# Patient Record
Sex: Male | Born: 1980 | Race: White | Hispanic: No | Marital: Single | Smoking: Current every day smoker
Health system: Southern US, Community
[De-identification: ages and names within clinical notes are randomized; demographics above are authoritative.]

## PROBLEM LIST (undated history)

## (undated) HISTORY — PX: TONSILLECTOMY: SUR1361

---

## 2015-05-21 ENCOUNTER — Encounter (HOSPITAL_COMMUNITY): Payer: Self-pay | Admitting: Emergency Medicine

## 2015-05-21 ENCOUNTER — Emergency Department (HOSPITAL_COMMUNITY)
Admission: EM | Admit: 2015-05-21 | Discharge: 2015-05-21 | Disposition: A | Discharge status: Home / Self Care | Payer: BLUE CROSS/BLUE SHIELD | Attending: Emergency Medicine | Admitting: Emergency Medicine

## 2015-05-21 DIAGNOSIS — W260XXA Contact with knife, initial encounter: Secondary | ICD-10-CM | POA: Diagnosis not present

## 2015-05-21 DIAGNOSIS — F17210 Nicotine dependence, cigarettes, uncomplicated: Secondary | ICD-10-CM | POA: Insufficient documentation

## 2015-05-21 DIAGNOSIS — Y9289 Other specified places as the place of occurrence of the external cause: Secondary | ICD-10-CM | POA: Diagnosis not present

## 2015-05-21 DIAGNOSIS — Z23 Encounter for immunization: Secondary | ICD-10-CM | POA: Insufficient documentation

## 2015-05-21 DIAGNOSIS — Y998 Other external cause status: Secondary | ICD-10-CM | POA: Diagnosis not present

## 2015-05-21 DIAGNOSIS — Y9389 Activity, other specified: Secondary | ICD-10-CM | POA: Diagnosis not present

## 2015-05-21 DIAGNOSIS — S61412A Laceration without foreign body of left hand, initial encounter: Secondary | ICD-10-CM

## 2015-05-21 MED ORDER — LIDOCAINE-EPINEPHRINE 1 %-1:100000 IJ SOLN: 10.0000 mL | Freq: Once | INTRAMUSCULAR | Status: DC

## 2015-05-21 MED ORDER — CEPHALEXIN 500 MG PO CAPS
500.0000 mg | ORAL_CAPSULE | Freq: Three times a day (TID) | ORAL | Status: AC
Start: 2015-05-21 — End: ?

## 2015-05-21 MED ORDER — IBUPROFEN 800 MG PO TABS: 800.0000 mg | ORAL_TABLET | Freq: Three times a day (TID) | ORAL | Status: AC

## 2015-05-21 MED ORDER — TETANUS-DIPHTH-ACELL PERTUSSIS 5-2.5-18.5 LF-MCG/0.5 IM SUSP
0.5000 mL | Freq: Once | INTRAMUSCULAR | Status: AC
  Administered 2015-05-21: 0.5 mL via INTRAMUSCULAR
  Filled 2015-05-21: qty 0.5

## 2015-05-21 MED ORDER — LIDOCAINE-EPINEPHRINE 2 %-1:100000 IJ SOLN
20.0000 mL | Freq: Once | INTRAMUSCULAR | Status: AC
  Administered 2015-05-21: 1 mL
  Filled 2015-05-21: qty 1

## 2015-05-21 MED ORDER — BACITRACIN ZINC 500 UNIT/GM EX OINT
1.0000 "application " | TOPICAL_OINTMENT | Freq: Two times a day (BID) | CUTANEOUS | Status: DC
  Administered 2015-05-21: 1 via TOPICAL
  Filled 2015-05-21: qty 0.9

## 2015-05-21 NOTE — ED Notes (Signed)
Pt states he was trying to pry a window open with a butter knife and the knife slipped cutting his right hand  Pt has a laceration noted to his right hand just above his thumb  Bleeding controlled  Pt is able to move his thumb

## 2015-05-21 NOTE — Discharge Instructions (Signed)

## 2015-05-21 NOTE — ED Provider Notes (Signed)
CSN: [id number]     Arrival date & time 05/21/15  1938 History   First MD Initiated Contact with Patient 05/21/15 1952     Chief Complaint  Patient presents with  . Extremity Laceration     (Consider location/radiation/quality/duration/timing/severity/associated sxs/prior Treatment) HPI  1 hour ago accidentally cut his L hand over the [facility] with a butter knife trying to open a window Pain is constant Mild Blood with this No nubmness Dressing pta with success  History reviewed. No pertinent past medical history. Past Surgical History  Procedure Laterality Date  . Tonsillectomy     Family History  Problem Relation Age of Onset  . Hypertension Other    Social History  Substance Use Topics  . Smoking status: Current Every Day Smoker    Types: Cigarettes  . Smokeless tobacco: None  . Alcohol Use: Yes    Review of Systems  Constitutional: Negative for fever.  Gastrointestinal: Negative for vomiting.  Skin: Positive for wound.       Laceration  Neurological: Negative for weakness and numbness.      Allergies  Review of patient's allergies indicates no known allergies.  Home Medications   Prior to Admission medications   Medication Sig Start Date End Date Taking? Authorizing Provider  cephALEXin (KEFLEX) 500 MG capsule Take 1 capsule (500 mg total) by mouth 3 (three) times daily. 05/21/15   Eber HongBrian Mahogani Holohan, MD  ibuprofen (ADVIL,MOTRIN) 800 MG tablet Take 1 tablet (800 mg total) by mouth 3 (three) times daily. 05/21/15   Eber HongBrian Dishawn Bhargava, MD   BP 122/75 mmHg  Pulse 70  Temp(Src) 98.2 F (36.8 C) (Oral)  Resp 16  SpO2 95% Physical Exam  Constitutional: He appears well-developed and well-nourished. No distress.  HENT:  Head: Normocephalic.  Eyes: Conjunctivae are normal. No scleral icterus.  Cardiovascular: Normal rate and regular rhythm.   Pulmonary/Chest: Effort normal and breath sounds normal.  Musculoskeletal: Normal range of motion. He exhibits tenderness ( ttp  over the laceration site). He exhibits no edema.  Neurological: He is alert. Coordination normal.  Sensation and motor intact  Skin: Skin is warm and dry. He is not diaphoretic.  Laceration located on L hand The Laceration is linear shaped The depth is moderate The length is 2.5 cm    ED Course  Procedures (including critical care time) Labs Review Labs Reviewed - No data to display  Imaging Review No results found. I have personally reviewed and evaluated these images and lab results as part of my medical decision-making.    MDM   Final diagnoses:  Laceration of hand, left, initial encounter    LACERATION REPAIR Performed by: Vida RollerMILLER,Shyne Resch D Authorized by: Vida RollerMILLER,Kenan Moodie D Consent: Verbal consent obtained. Risks and benefits: risks, benefits and alternatives were discussed Consent given by: patient Patient identity confirmed: provided demographic data Prepped and Draped in normal sterile fashion Wound explored  Laceration Location: L hand  Laceration Length: 3cm  No Foreign Bodies seen or palpated  Anesthesia: local infiltration  Local anesthetic: lidocaine 1% with epinephrine  Anesthetic total: 2 ml  Irrigation method: syringe Amount of cleaning: standard  Skin closure: 4-0 prolene  Number of sutures: 3  Technique: Horizontal Mattress  Patient tolerance: Patient tolerated the procedure well with no immediate complications.     Eber HongBrian Cleotilde Spadaccini, MD 05/21/15 2009

## 2015-05-31 ENCOUNTER — Emergency Department (HOSPITAL_COMMUNITY)
Admission: EM | Admit: 2015-05-31 | Discharge: 2015-05-31 | Disposition: A | Discharge status: Home / Self Care | Payer: Worker's Compensation | Attending: Emergency Medicine | Admitting: Emergency Medicine

## 2015-05-31 ENCOUNTER — Emergency Department (HOSPITAL_COMMUNITY): Payer: Worker's Compensation

## 2015-05-31 ENCOUNTER — Encounter (HOSPITAL_COMMUNITY): Payer: Self-pay | Admitting: *Deleted

## 2015-05-31 DIAGNOSIS — Y990 Civilian activity done for income or pay: Secondary | ICD-10-CM | POA: Diagnosis not present

## 2015-05-31 DIAGNOSIS — S3992XA Unspecified injury of lower back, initial encounter: Secondary | ICD-10-CM | POA: Diagnosis not present

## 2015-05-31 DIAGNOSIS — X58XXXA Exposure to other specified factors, initial encounter: Secondary | ICD-10-CM | POA: Insufficient documentation

## 2015-05-31 DIAGNOSIS — S8992XA Unspecified injury of left lower leg, initial encounter: Secondary | ICD-10-CM | POA: Diagnosis not present

## 2015-05-31 DIAGNOSIS — S8991XA Unspecified injury of right lower leg, initial encounter: Secondary | ICD-10-CM | POA: Insufficient documentation

## 2015-05-31 DIAGNOSIS — Y93H3 Activity, building and construction: Secondary | ICD-10-CM | POA: Diagnosis not present

## 2015-05-31 DIAGNOSIS — R05 Cough: Secondary | ICD-10-CM | POA: Diagnosis not present

## 2015-05-31 DIAGNOSIS — Y9289 Other specified places as the place of occurrence of the external cause: Secondary | ICD-10-CM | POA: Insufficient documentation

## 2015-05-31 DIAGNOSIS — F17210 Nicotine dependence, cigarettes, uncomplicated: Secondary | ICD-10-CM | POA: Insufficient documentation

## 2015-05-31 MED ORDER — OXYCODONE-ACETAMINOPHEN 5-325 MG PO TABS: 1.0000 | ORAL_TABLET | Freq: Four times a day (QID) | ORAL | Status: AC | PRN

## 2015-05-31 MED ORDER — DIAZEPAM 5 MG PO TABS
5.0000 mg | ORAL_TABLET | Freq: Once | ORAL | Status: AC
  Administered 2015-05-31: 5 mg via ORAL
  Filled 2015-05-31: qty 1

## 2015-05-31 MED ORDER — KETOROLAC TROMETHAMINE 30 MG/ML IJ SOLN
30.0000 mg | Freq: Once | INTRAMUSCULAR | Status: AC
  Administered 2015-05-31: 30 mg via INTRAVENOUS
  Filled 2015-05-31: qty 1

## 2015-05-31 MED ORDER — PREDNISONE 50 MG PO TABS: 50.0000 mg | ORAL_TABLET | Freq: Every day | ORAL | Status: AC

## 2015-05-31 MED ORDER — CYCLOBENZAPRINE HCL 10 MG PO TABS: 10.0000 mg | ORAL_TABLET | Freq: Every day | ORAL | Status: AC

## 2015-05-31 MED ORDER — HYDROMORPHONE HCL 1 MG/ML IJ SOLN
1.0000 mg | Freq: Once | INTRAMUSCULAR | Status: AC
  Administered 2015-05-31: 1 mg via INTRAVENOUS
  Filled 2015-05-31: qty 1

## 2015-05-31 NOTE — ED Provider Notes (Signed)
CSN: [id number]     Arrival date & time 05/31/15  [id number] History   First MD Initiated Contact with Patient 05/31/15 0930     Chief Complaint  Patient presents with  . Back Pain     (Consider location/radiation/quality/duration/timing/severity/associated sxs/prior Treatment) HPI Glenn Wood is a 35 year old white male with no significant past medical history who presents to the ED today via ambulance complaining of back pain. Around 9am this morning patient was straddling a beam, pulling out a bolt and felt a tearing sensation in his back. He immediately felt sharp 10/10 pain in his left lower back. He was unable to walk at this time due to pain and crawled to seek help. Coughing, straightening legs and movement makes it worse. He denies loss of sensation and continence, syncope, lightheadeness, N/V.  History reviewed. No pertinent past medical history. Past Surgical History  Procedure Laterality Date  . Tonsillectomy     Family History  Problem Relation Age of Onset  . Hypertension Other    Social History  Substance Use Topics  . Smoking status: Current Every Day Smoker    Types: Cigarettes  . Smokeless tobacco: None  . Alcohol Use: Yes    Review of Systems  Constitutional: Positive for activity change. Negative for fever.  Respiratory: Positive for cough. Negative for chest tightness and shortness of breath.   Cardiovascular: Negative for chest pain, palpitations and leg swelling.  Gastrointestinal: Negative for nausea, vomiting, abdominal pain, diarrhea and constipation.  Genitourinary: Negative for dysuria, hematuria and difficulty urinating.  Musculoskeletal: Positive for back pain and gait problem.  Neurological: Negative for dizziness, syncope, light-headedness and headaches.       No numbness in bilateral lower extremities  Psychiatric/Behavioral: Negative for confusion and decreased concentration.      Allergies  Review of patient's allergies indicates no known  allergies.  Home Medications   Prior to Admission medications   Medication Sig Start Date End Date Taking? Authorizing Provider  cephALEXin (KEFLEX) 500 MG capsule Take 1 capsule (500 mg total) by mouth 3 (three) times daily. 05/21/15   Eber HongBrian Miller, MD  ibuprofen (ADVIL,MOTRIN) 800 MG tablet Take 1 tablet (800 mg total) by mouth 3 (three) times daily. 05/21/15   Eber HongBrian Miller, MD   BP 142/85 mmHg  Pulse 60  Temp(Src) 98.2 F (36.8 C) (Oral)  Resp 20  SpO2 98% Physical Exam  Constitutional: He is oriented to person, place, and time. He appears well-developed and well-nourished. He appears distressed.  Cardiovascular: Normal rate, regular rhythm and normal heart sounds.   Pulmonary/Chest: Effort normal and breath sounds normal.  Abdominal: Soft. Bowel sounds are normal. He exhibits no distension. There is no tenderness.  Musculoskeletal: He exhibits no edema or tenderness.  Decreased passive range of motion in bilateral lower extremities due to pain  Neurological: He is alert and oriented to person, place, and time.  Decreased strength at 3/5 due to pain in bilateral lower extremities. 5/5 strength in bilateral feet. Intact sensation in bilateral lower extremities.  Skin:  Unable to visualize back as patient could not turn to right side due to pain    ED Course  Procedures (including critical care time) Labs Review Labs Reviewed - No data to display  Imaging Review No results found. I have personally reviewed and evaluated these images and lab results as part of my medical decision-making.  Patient is feeling best improvement and able to walk without difficulty.  Patient's best return here as needed.  Told  to use ice and heat on his lower back feel is most likely a muscular strain based on his mechanism injury and physical exam findings.  Patient has no neurological deficits and has normal reflexes    Charlestine Night, PA-C 06/01/15 1707  Melene Plan, DO 06/01/15 1803

## 2015-05-31 NOTE — ED Notes (Signed)
Pt to xray

## 2015-05-31 NOTE — ED Notes (Addendum)
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 Pain left lower back at the belt area, flank area. Denies hx of kidney stones. Finished abx recently due to finger laceration. Intact PMS, no radiation of pain. Severe pain with attempting to walk.  Upon rn assessment pt denies loss of control of bowel or bladder. Denies numbness or tingling. Pain 10/10. Able to move all extremities. reports hx of tearing muscle in back over 5 years ago.

## 2015-05-31 NOTE — ED Notes (Signed)
Pt escorted to discharge window. Pt verbalized understanding discharge instructions. In no acute distress.  

## 2015-05-31 NOTE — Discharge Instructions (Signed)
Return here as needed.  Use ice and heat on your low back.  Follow-up with the, Dr. provided

## 2017-08-19 IMAGING — CR DG LUMBAR SPINE COMPLETE 4+V
5 series · 5 of 5 positions shown · non-contrast
Comparison: None.

CLINICAL DATA: Twisting injury at work with lower back pain,
initial encounter

EXAM:
LUMBAR SPINE - COMPLETE 4+ VIEW

[t lumbar spine ap]
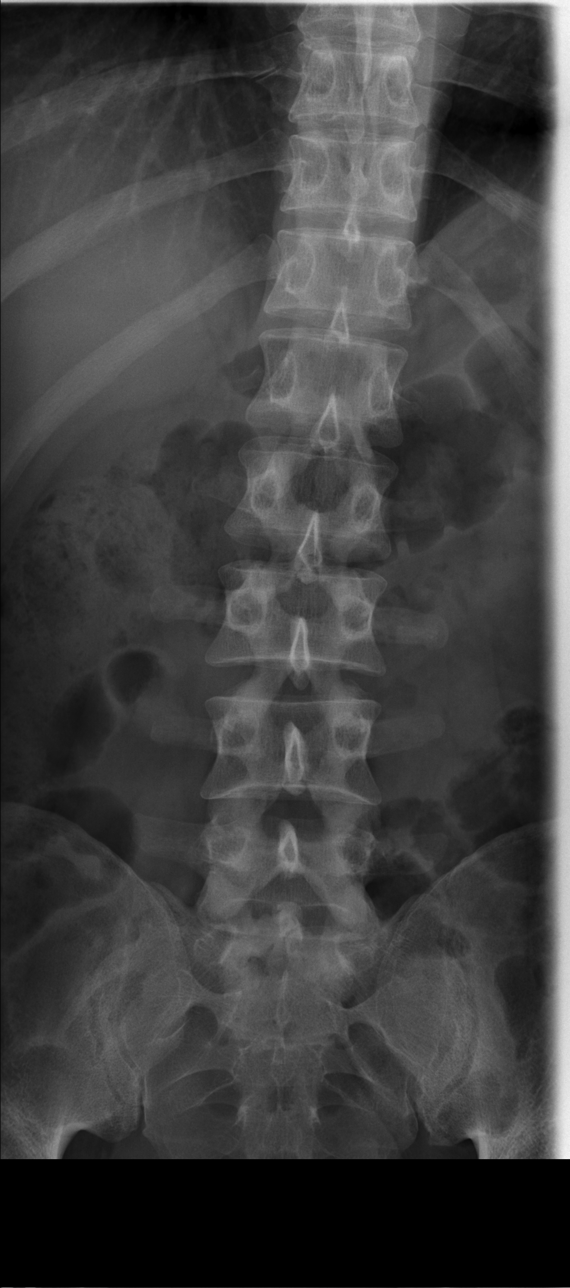

[t lumbar spine obl (1 of 2)]
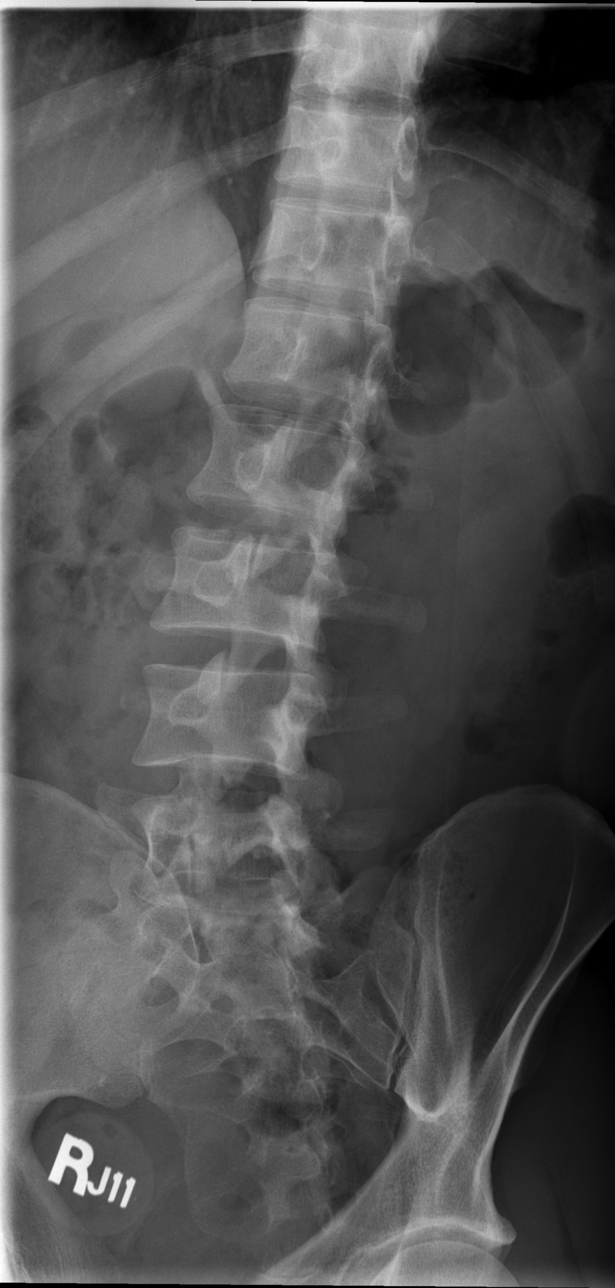

[t lumbar spine obl (2 of 2)]
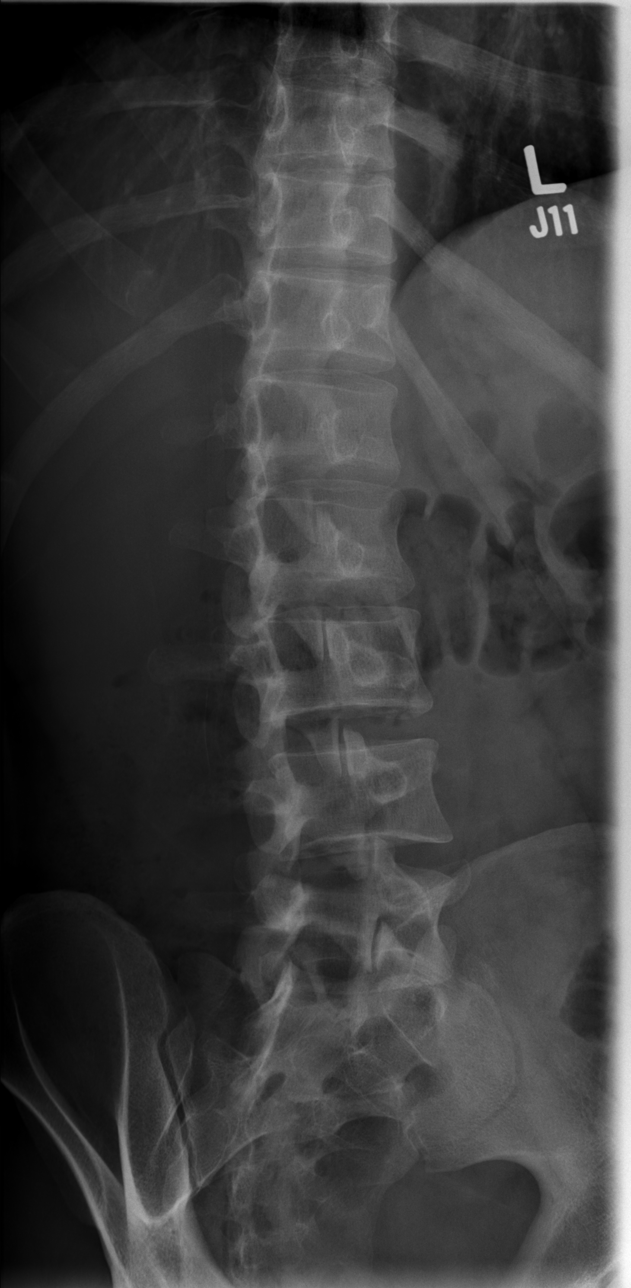

[t lumbar spine lat]
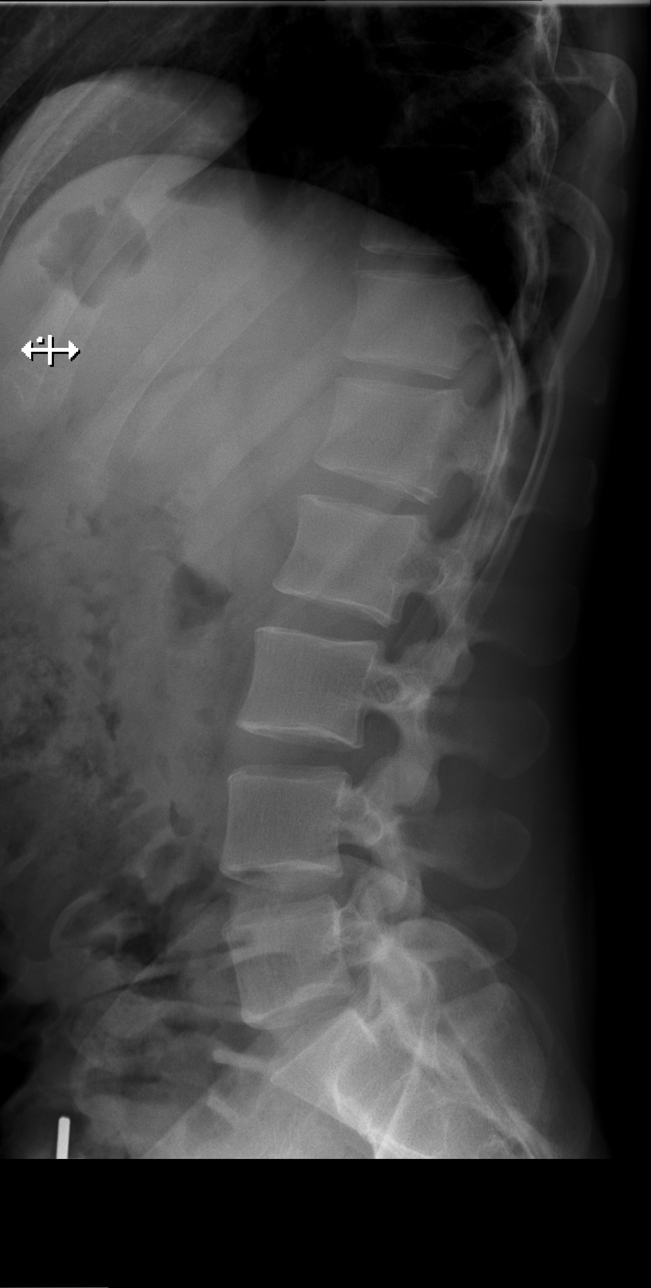

[t lumbar l-5 s-1 spot]
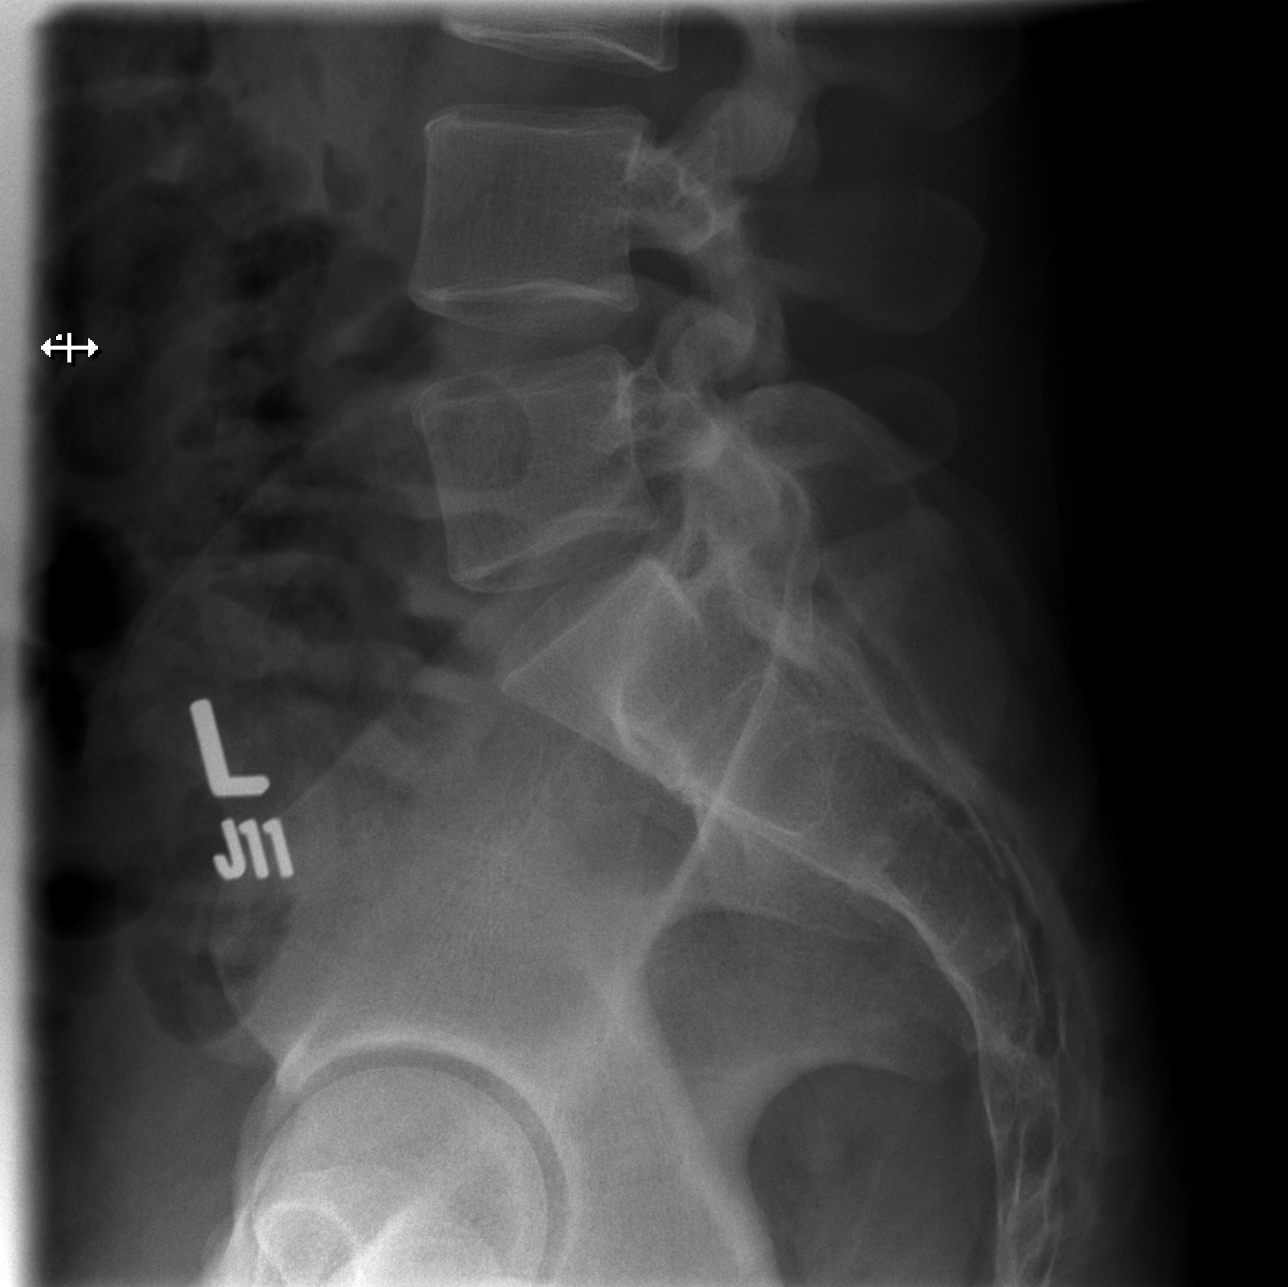

[5 of 5 positions shown; findings below may reference images not displayed]

FINDINGS: There is no evidence of lumbar spine fracture. Alignment is normal.
Intervertebral disc spaces are maintained.
IMPRESSION: No acute abnormality noted.
